# Patient Record
Sex: Male | Born: 2005 | Race: Black or African American | Hispanic: No | Marital: Single | Smoking: Never smoker
Health system: Southern US, Community
[De-identification: ages and names within clinical notes are randomized; demographics above are authoritative.]

## PROBLEM LIST (undated history)

## (undated) ENCOUNTER — Emergency Department (HOSPITAL_BASED_OUTPATIENT_CLINIC_OR_DEPARTMENT_OTHER): Admission: EM | Payer: 59 | Source: Home / Self Care

## (undated) DIAGNOSIS — T7840XA Allergy, unspecified, initial encounter: Secondary | ICD-10-CM

---
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
## 2023-03-15 ENCOUNTER — Ambulatory Visit: Payer: 59 | Admitting: Family Medicine

## 2023-03-15 VITALS — BP 138/63 | HR 68 | Ht 68.0 in | Wt 303.2 lb

## 2023-03-15 DIAGNOSIS — F411 Generalized anxiety disorder: Secondary | ICD-10-CM | POA: Insufficient documentation

## 2023-03-15 DIAGNOSIS — E66813 Obesity, class 3: Secondary | ICD-10-CM | POA: Insufficient documentation

## 2023-03-15 DIAGNOSIS — R5383 Other fatigue: Secondary | ICD-10-CM | POA: Diagnosis not present

## 2023-03-15 DIAGNOSIS — F321 Major depressive disorder, single episode, moderate: Secondary | ICD-10-CM | POA: Diagnosis not present

## 2023-03-15 DIAGNOSIS — Z6841 Body Mass Index (BMI) 40.0 and over, adult: Secondary | ICD-10-CM | POA: Insufficient documentation

## 2023-03-15 MED ORDER — FLUOXETINE HCL 10 MG PO TABS: 10.0000 mg | ORAL_TABLET | Freq: Every day | ORAL | 3 refills | Status: DC

## 2023-03-15 NOTE — Assessment & Plan Note (Addendum)
A1c, lipid panel checked at wellness visit three months ago was normal  PHQ9 and GAD7 are elevated providing new diagnoses  Vit D and b12 levels checked

## 2023-03-15 NOTE — Assessment & Plan Note (Signed)
Will go ahead and start pt on prozac and have him follow up in one month I do believe a lot of his fatigue is related to this. He is agreeable to therapy so we will go ahead and refer.  - denies homicidal and suicidal ideation

## 2023-03-15 NOTE — Progress Notes (Signed)
Established patient visit   Patient: Devin Jacobs   DOB: 2005-08-08   18 y.o. Male  MRN: [id number] Visit Date: 03/15/2023  Today's healthcare provider: Charlton Amor, DO   Chief Complaint  Patient presents with   Fatigue    x107month    SUBJECTIVE    Chief Complaint  Patient presents with   Fatigue    x47month   HPI HPI     Fatigue    Additional comments: x12month      Last edited by Roselyn Reef, CMA on 03/15/2023  3:07 PM.       Pt presents with one month of fatigue. Also admits to not feeling like himself.  Review of Systems  Constitutional:  Positive for fatigue. Negative for activity change and fever.  Respiratory:  Negative for cough and shortness of breath.   Cardiovascular:  Negative for chest pain.  Gastrointestinal:  Negative for abdominal pain.  Genitourinary:  Negative for difficulty urinating.       Current Meds  Medication Sig   Cetirizine HCl (ZYRTEC PO) Take by mouth.   FLUoxetine (PROZAC) 10 MG tablet Take 1 tablet (10 mg total) by mouth daily.   fluticasone (FLONASE) 50 MCG/ACT nasal spray Place into both nostrils daily.    OBJECTIVE    BP 138/63 (BP Location: Left Arm, Patient Position: Sitting, Cuff Size: Large)   Pulse 68   Ht 5\' 8"  (1.727 m)   Wt (!) 303 lb 4 oz (137.6 kg)   SpO2 99%   BMI 46.11 kg/m   Physical Exam Vitals and nursing note reviewed.  Constitutional:      General: He is not in acute distress.    Appearance: Normal appearance.  HENT:     Head: Normocephalic and atraumatic.     Right Ear: External ear normal.     Left Ear: External ear normal.     Nose: Nose normal.  Eyes:     Conjunctiva/sclera: Conjunctivae normal.  Cardiovascular:     Rate and Rhythm: Normal rate and regular rhythm.  Pulmonary:     Effort: Pulmonary effort is normal.     Breath sounds: Normal breath sounds.  Neurological:     General: No focal deficit present.     Mental Status: He is alert and oriented to person, place, and  time.  Psychiatric:        Mood and Affect: Mood normal.        Behavior: Behavior normal.        Thought Content: Thought content normal.        Judgment: Judgment normal.        ASSESSMENT & PLAN    Problem List Items Addressed This Visit       Other   Other fatigue - Primary   A1c, lipid panel checked at wellness visit three months ago was normal  PHQ9 and GAD7 are elevated providing new diagnoses  Vit D and b12 levels checked      Relevant Orders   TSH + free T4   Vitamin B12   Vitamin D (25 hydroxy)   Class 3 severe obesity due to excess calories with serious comorbidity and body mass index (BMI) of 45.0 to 49.9 in adult (HCC)   Relevant Orders   TSH + free T4   Vitamin B12   Vitamin D (25 hydroxy)   Current moderate episode of major depressive disorder without prior episode (HCC)   Will go ahead and start pt on prozac  and have him follow up in one month I do believe a lot of his fatigue is related to this. He is agreeable to therapy so we will go ahead and refer.  - denies homicidal and suicidal ideation      Relevant Medications   FLUoxetine (PROZAC) 10 MG tablet   Other Relevant Orders   Ambulatory referral to Behavioral Health   GAD (generalized anxiety disorder)   Relevant Medications   FLUoxetine (PROZAC) 10 MG tablet   Other Relevant Orders   Ambulatory referral to Behavioral Health    Return in about 4 weeks (around 04/12/2023).      Meds ordered this encounter  Medications   FLUoxetine (PROZAC) 10 MG tablet    Sig: Take 1 tablet (10 mg total) by mouth daily.    Dispense:  30 tablet    Refill:  3    Orders Placed This Encounter  Procedures   TSH + free T4   Vitamin B12   Vitamin D (25 hydroxy)   Ambulatory referral to Behavioral Health    Referral Priority:   Routine    Referral Type:   Psychiatric    Referral Reason:   Specialty Services Required    Requested Specialty:   Behavioral Health    Number of Visits Requested:   1      Charlton Amor, DO  [facility] Primary Care & Sports Medicine at [facility] [phone] (phone) [phone] (fax)  [facility]

## 2023-03-16 ENCOUNTER — Encounter: Payer: Self-pay | Admitting: Family Medicine

## 2023-03-16 ENCOUNTER — Other Ambulatory Visit: Payer: Self-pay | Admitting: Family Medicine

## 2023-03-16 LAB — VITAMIN D 25 HYDROXY (VIT D DEFICIENCY, FRACTURES): Vit D, 25-Hydroxy: 9.2 ng/mL — ABNORMAL LOW (ref 30.0–100.0)

## 2023-03-16 LAB — TSH+FREE T4
Free T4: 1.17 ng/dL (ref 0.93–1.60)
TSH: 1.7 u[IU]/mL (ref 0.450–4.500)

## 2023-03-16 LAB — VITAMIN B12: Vitamin B-12: 331 pg/mL (ref 232–1245)

## 2023-03-16 MED ORDER — CHOLECALCIFEROL 1.25 MG (50000 UT) PO TABS: 1.0000 | ORAL_TABLET | ORAL | 0 refills | Status: DC

## 2023-04-01 ENCOUNTER — Ambulatory Visit: Payer: Self-pay | Admitting: Family Medicine

## 2023-04-01 NOTE — Telephone Encounter (Signed)
 Chief Complaint: foot injury Symptoms: bruise, rounded Frequency: 2 weeks ago Pertinent Negatives: Patient denies fever, inability to walk, pain Disposition: [] ED /[] Urgent Care (no appt availability in office) / [x] Appointment(In office/virtual)/ []  [facility] [facility]/ [] Home Care/ [] Refused Recommended Disposition /[] [place] [facility]/ []  Follow-up with PCP Additional Notes: Patient mother, Jinx, calling with patient next to her c/o foot injury that occurred 2 weeks ago. Patient states he was walking barefoot and stepped on something. Reports they went to UC yesterday and I&D was performed. Mother reports they were unsatisfied with care there and wanted F/U with PCP. Of note, an X-ray was performed today to check for FB left in foot. Scheduled patient per protocol on 04/05/2023. Caregiver verbalized understanding and to call back with worsening symptoms.    Copied from CRM [id number]. Topic: Clinical - Red Word Triage >> Apr 01, 2023  3:40 PM Joesph PARAS wrote: Red Word that prompted transfer to Nurse Triage:   Patient was seen in urgent care through [facility] but mother believes he did not recieve proper care. patient stepped on something roughly two weeks ago with left, pus present and drained last night at urgent care, x-ray done but thinks object stepped on is still in the foot. Reason for Disposition  Minor puncture wound  Answer Assessment - Initial Assessment Questions 2. OBJECT: What was the object that punctured the skin?      unknown 3. DEPTH: How deep do you think the puncture goes?      unknown 4. ONSET: When did the injury occur? (Minutes or hours)     2 weeks ago  Answer Assessment - Initial Assessment Questions 1. MECHANISM: How did the injury happen? (e.g., twisting injury, direct blow)      Wlaking barefoot around the house and stepped on something 2. ONSET: When did the injury happen? (Minutes or hours ago)      2 weeks ago 3. LOCATION: Where is the  injury located?      L foot 4. APPEARANCE of INJURY: What does the injury look like?      Bruised looking - looks like it's gotten worse 5. WEIGHT-BEARING: Can you put weight on that foot? Can you walk (four steps or more)?       Yes 6. SIZE: For cuts, bruises, or swelling, ask: How large is it? (e.g., inches or centimeters;  entire joint)      Size of thumbtack head 7. PAIN: Is there pain? If Yes, ask: How bad is the pain?    (e.g., Scale 1-10; or mild, moderate, severe)   - NONE (0): no pain.   - MILD (1-3): doesn't interfere with normal activities.    - MODERATE (4-7): interferes with normal activities (e.g., work or school) or awakens from sleep, limping.    - SEVERE (8-10): excruciating pain, unable to do any normal activities, unable to walk.      none 8. TETANUS: For any breaks in the skin, ask: When was the last tetanus booster?     Doesn't remember last TDaP. Mom reports he is UTD on all school shots 9. OTHER SYMPTOMS: Do you have any other symptoms?      No, just looks bruised and rounded. Middle is red Reports that they went to UC last night-- reports MD took a needle and poked it to let the pus out, and then used a scalpel Mom reports that MD stated he might need and X-ray in case of FB still inside foot  Protocols used:  Ankle and Foot Injury-A-AH, Puncture Wound-A-AH

## 2023-04-05 ENCOUNTER — Encounter: Payer: Self-pay | Admitting: Medical-Surgical

## 2023-04-05 ENCOUNTER — Ambulatory Visit (INDEPENDENT_AMBULATORY_CARE_PROVIDER_SITE_OTHER): Payer: 59 | Admitting: Medical-Surgical

## 2023-04-05 VITALS — BP 118/73 | HR 78 | Resp 20 | Ht 68.01 in | Wt 307.8 lb

## 2023-04-05 DIAGNOSIS — S90852D Superficial foreign body, left foot, subsequent encounter: Secondary | ICD-10-CM

## 2023-04-05 DIAGNOSIS — F321 Major depressive disorder, single episode, moderate: Secondary | ICD-10-CM | POA: Diagnosis not present

## 2023-04-05 DIAGNOSIS — S90859A Superficial foreign body, unspecified foot, initial encounter: Secondary | ICD-10-CM | POA: Insufficient documentation

## 2023-04-05 MED ORDER — FLUOXETINE HCL 10 MG PO TABS: 10.0000 mg | ORAL_TABLET | Freq: Every day | ORAL | 0 refills | Status: DC

## 2023-04-05 NOTE — Progress Notes (Signed)
 Medical screening examination/treatment was performed by qualified clinical staff member and as supervising provider I was immediately available for consultation/collaboration. I have reviewed documentation and agree with assessment and plan.  Thayer Ohm, DNP, APRN, FNP-BC [facility]

## 2023-04-05 NOTE — Progress Notes (Signed)
 Subjective:  Patient ID: Devin Jacobs, male    DOB: Mar 28, 2005, 18 y.o.   MRN: [id number]  Patient Care Team: Josepha Nickels, DO as PCP - General (Family Medicine)   Chief Complaint:  Foot Injury (LEFT)   HPI:  Devin Jacobs is a 18 y.o. male presenting on 04/05/2023 for Foot Injury (LEFT)   History, Exam,  Impression and Plan Foot Injury    1. Splinter of foot without major open wound, left, subsequent encounter (Primary) Per patient's mother patient stepped on something at home but got infected and developed a "puss boil" on proximal plantar foot centered medially just distal to calcaneous. Patient seen 04/01/2023 and "puss boil" was unroofed and irrigated. Patient's mother noted puss came out and it bled when irrigated.  Patient's mother is concerned about a small mark that looks like a foreign body or splinter but is a discoloration possibly from blood that is approximately 1cm x93mm.  Wound is healing well without complications with only a rough callus remaining. 1x1cm. No signs of infection.  Negative erythema or edema.  No tenderness to palpation. Unable to palpate any foreign body.  Patient and his mother have been cleaning the wound site several times daily.  Wound is mostly healed requires no further antibiotics or wound care.    2. Current moderate episode of major depressive disorder, unspecified whether recurrent (HCC) Patient seen 03/15/2023 by Dr. Leanne Pronto. Patient did not start fluoxetine  because prescription was sent to different pharmacy and patient's mother was not with him to follow up. Mother states she was not aware that fluoxetine  was prescribed.  Patient also had referral for mental health counseling but has not been called for appointment. Has persistent primary complaint of chronic fatigue and anxiey and denies SI/HI.  Dr. Dianah Fort previously stated that she believes his fatigue is related to due to depression. Patient's mother and patient are both agreeable to  treatment with fluoxetine  and counseling. - Start FLUoxetine  (PROZAC ) 10 MG tablet; Take 1 tablet (10 mg total) by mouth daily.  Dispense: 30 tablet; Refill: 0. For anxiety and depression  Continue all other maintenance medications.  Follow up plan: Return in about 4 weeks (around 05/03/2023) for mood follow up with PCP.  Patient contacted to call or return to clinic with any SI/HI thoughts.   Relevant past medical, surgical, family, and social history reviewed and updated as indicated.  Allergies and medications reviewed and updated. Data reviewed: Chart in Epic.   Past Medical History:  Diagnosis Date   Allergy     No past surgical history on file.  Social History   Socioeconomic History   Marital status: Single    Spouse name: Not on file   Number of children: Not on file   Years of education: Not on file   Highest education level: 12th grade  Occupational History   Not on file  Tobacco Use   Smoking status: Never    Passive exposure: Yes   Smokeless tobacco: Never  Vaping Use   Vaping status: Never Used  Substance and Sexual Activity   Alcohol use: Never   Drug use: Never   Sexual activity: Never  Other Topics Concern   Not on file  Social History Narrative   Not on file   Social Drivers of Health   Financial Resource Strain: Medium Risk (03/15/2023)   Overall Financial Resource Strain (CARDIA)    Difficulty of Paying Living Expenses: Somewhat hard  Food Insecurity: Food Insecurity Present (03/15/2023)  Hunger Vital Sign    Worried About Running Out of Food in the Last Year: Often true    Ran Out of Food in the Last Year: Often true  Transportation Needs: No Transportation Needs (03/15/2023)   PRAPARE - Administrator, Civil Service (Medical): No    Lack of Transportation (Non-Medical): No  Physical Activity: Unknown (03/15/2023)   Exercise Vital Sign    Days of Exercise per Week: 0 days    Minutes of Exercise per Session: Not on file  Stress:  Stress Concern Present (03/15/2023)   [employer] of Occupational Health - Occupational Stress Questionnaire    Feeling of Stress : To some extent  Social Connections: Socially Isolated (03/15/2023)   Social Connection and Isolation Panel [NHANES]    Frequency of Communication with Friends and Family: Once a week    Frequency of Social Gatherings with Friends and Family: Twice a week    Attends Religious Services: Never    Database administrator or Organizations: No    Attends Engineer, structural: Not on file    Marital Status: Never married  Intimate Partner Violence: Unknown (05/29/2021)   Received from [employer], [facility]   HITS    Physically Hurt: Not on file    Insult or Talk Down To: Not on file    Threaten Physical Harm: Not on file    Scream or Curse: Not on file    Outpatient Encounter Medications as of 04/05/2023  Medication Sig   [DISCONTINUED] FLUoxetine  (PROZAC ) 10 MG tablet Take 1 tablet (10 mg total) by mouth daily.   FLUoxetine  (PROZAC ) 10 MG tablet Take 1 tablet (10 mg total) by mouth daily.   [DISCONTINUED] Cetirizine HCl (ZYRTEC PO) Take by mouth.   [DISCONTINUED] Cholecalciferol  1.25 MG (50000 UT) TABS Take 1 tablet by mouth once a week.   [DISCONTINUED] fluticasone (FLONASE) 50 MCG/ACT nasal spray Place into both nostrils daily.   No facility-administered encounter medications on file as of 04/05/2023.    No Known Allergies  Review of Systems      Objective:  BP 118/73 (BP Location: Right Arm, Cuff Size: Large)   Pulse 78   Resp 20   Ht 5' 8.01" (1.727 m)   Wt (!) 307 lb 12.8 oz (139.6 kg)   SpO2 98%   BMI 46.79 kg/m    Wt Readings from Last 3 Encounters:  04/05/23 (!) 307 lb 12.8 oz (139.6 kg) (>99%, Z= 3.14)*  03/15/23 (!) 303 lb 4 oz (137.6 kg) (>99%, Z= 3.09)*  12/08/22 297 lb 4 oz (134.8 kg) (>99%, Z= 3.02)*   * Growth percentiles are based on CDC (Boys, 2-20 Years) data.    Physical Exam Musculoskeletal:        Feet:  Feet:     Comments: Unroofed  "puss blister" without S&S of infection and healing well without complications    Results for orders placed or performed in visit on 03/15/23  TSH + free T4   Collection Time: 03/15/23  3:34 PM  Result Value Ref Range   TSH 1.700 0.450 - 4.500 uIU/mL   Free T4 1.17 0.93 - 1.60 ng/dL  Vitamin B12   Collection Time: 03/15/23  3:34 PM  Result Value Ref Range   Vitamin B-12 331 232 - 1,245 pg/mL  Vitamin D  (25 hydroxy)   Collection Time: 03/15/23  3:34 PM  Result Value Ref Range   Vit D, 25-Hydroxy 9.2 (L) 30.0 - 100.0 ng/mL  Pertinent labs & imaging results that were available during my care of the patient were reviewed by me and considered in my medical decision making.   Continue healthy lifestyle choices, including diet (rich in fruits, vegetables, and lean proteins, and low in salt and simple carbohydrates) and exercise (at least 30 minutes of moderate physical activity daily).  The above assessment and management plan was discussed with the patient. The patient verbalized understanding of and has agreed to the management plan. Patient is aware to call the clinic if they develop any new symptoms or if symptoms persist or worsen. Patient is aware when to return to the clinic for a follow-up visit. Patient educated on when it is appropriate to go to the emergency department.   Caridad Chard Student AGNP

## 2023-04-12 ENCOUNTER — Ambulatory Visit: Payer: 59 | Admitting: Family Medicine

## 2023-05-06 ENCOUNTER — Ambulatory Visit: Payer: Self-pay | Admitting: Family Medicine

## 2023-05-06 NOTE — Telephone Encounter (Signed)
 1st attempt to call patient's phone, "call cannot be completed as dialed" error message.  2nd attempt to call patient's mother's phone, "call cannot be completed" as dialed error message.  Summary: red patches on tongue and back of throat, cough, sore throat   Copied From CRM [id number]. Reason for Triage: Patient okayed for [place] to talk to mom, [facility]. Patient has cough, sore throat, low grade fever, runny nose,red patches on tongue and back of throat, severe allergies,  this started 03/12 in the morning.

## 2023-05-06 NOTE — Telephone Encounter (Signed)
 4th attempt. "Call cannot be completed as dialed". Routing to PCP clinic.

## 2023-05-06 NOTE — Telephone Encounter (Signed)
 Second attempt to contact patient-unable to speak or leave message. "Call cannot be completed as dialed."  Summary: red patches on tongue and back of throat, cough, sore throat   Copied From CRM [id number]. Reason for Triage: Patient okayed for [place] to talk to mom, [facility]. Patient has cough, sore throat, low grade fever, runny nose,red patches on tongue and back of throat, severe allergies,  this started 03/12 in the morning.

## 2023-05-07 ENCOUNTER — Telehealth (INDEPENDENT_AMBULATORY_CARE_PROVIDER_SITE_OTHER): Admitting: Physician Assistant

## 2023-05-07 ENCOUNTER — Encounter: Payer: Self-pay | Admitting: Physician Assistant

## 2023-05-07 ENCOUNTER — Ambulatory Visit: Payer: Self-pay | Admitting: Family Medicine

## 2023-05-07 VITALS — Ht 68.0 in | Wt 307.0 lb

## 2023-05-07 DIAGNOSIS — J101 Influenza due to other identified influenza virus with other respiratory manifestations: Secondary | ICD-10-CM

## 2023-05-07 DIAGNOSIS — J301 Allergic rhinitis due to pollen: Secondary | ICD-10-CM | POA: Diagnosis not present

## 2023-05-07 MED ORDER — OSELTAMIVIR PHOSPHATE 75 MG PO CAPS: 75.0000 mg | ORAL_CAPSULE | Freq: Two times a day (BID) | ORAL | 0 refills | Status: AC

## 2023-05-07 MED ORDER — ONDANSETRON HCL 4 MG PO TABS: 4.0000 mg | ORAL_TABLET | Freq: Three times a day (TID) | ORAL | 0 refills | Status: AC | PRN

## 2023-05-07 NOTE — Progress Notes (Signed)
 Virtual Visit via Video Note   This visit type was conducted per patient request This format is felt to be appropriate for this patient at this time.  All issues noted in this document were discussed and addressed.  A limited physical exam was performed with this format.  A verbal consent was obtained for the virtual visit.   Date:  05/10/2023   ID:  Devin Jacobs, DOB 07/08/2005, MRN [id number]  Patient Location: Home Provider Location: Office/Clinic  PCP:  Charlton Amor, DO   Chief Complaint  Patient presents with   Sore throat     History of Present Illness:       The patient does have symptoms concerning for COVID-19 infection (fever, chills, cough, or new shortness of breath).   COVID test at home was negative. Flu Test was positive.   Discussed the use of AI scribe software for clinical note transcription with the patient, who gave verbal consent to proceed.  History of Present Illness   The patient, with a history of allergies managed with Zyrtec, presents with a positive flu test. He has been symptomatic since Wednesday with coughing, sneezing, runny nose, vomiting, and headache. The vomiting started this morning and was a single episode. He denies diarrhea and is currently asymptomatic for nausea. He is hydrating well and has been advised to consume electrolyte drinks to prevent dehydration.         Past Medical History:  Diagnosis Date   Allergy     History reviewed. No pertinent surgical history.  Family History  Problem Relation Age of Onset   Kidney Stones Mother    Obesity Mother     Social History   Socioeconomic History   Marital status: Single    Spouse name: Not on file   Number of children: Not on file   Years of education: Not on file   Highest education level: 12th grade  Occupational History   Not on file  Tobacco Use   Smoking status: Never    Passive exposure: Yes   Smokeless tobacco: Never  Vaping Use   Vaping status: Never  Used  Substance and Sexual Activity   Alcohol use: Never   Drug use: Never   Sexual activity: Never  Other Topics Concern   Not on file  Social History Narrative   Not on file   Social Drivers of Health   Financial Resource Strain: Medium Risk (03/15/2023)   Overall Financial Resource Strain (CARDIA)    Difficulty of Paying Living Expenses: Somewhat hard  Food Insecurity: Food Insecurity Present (03/15/2023)   Hunger Vital Sign    Worried About Running Out of Food in the Last Year: Often true    Ran Out of Food in the Last Year: Often true  Transportation Needs: No Transportation Needs (03/15/2023)   PRAPARE - Administrator, Civil Service (Medical): No    Lack of Transportation (Non-Medical): No  Physical Activity: Unknown (03/15/2023)   Exercise Vital Sign    Days of Exercise per Week: 0 days    Minutes of Exercise per Session: Not on file  Stress: Stress Concern Present (03/15/2023)   [employer] of Occupational Health - Occupational Stress Questionnaire    Feeling of Stress : To some extent  Social Connections: Socially Isolated (03/15/2023)   Social Connection and Isolation Panel [NHANES]    Frequency of Communication with Friends and Family: Once a week    Frequency of Social Gatherings with Friends and Family: Twice  a week    Attends Religious Services: Never    Active Member of Clubs or Organizations: No    Attends Banker Meetings: Not on file    Marital Status: Never married  Intimate Partner Violence: Unknown (05/29/2021)   Received from [facility]   HITS    Physically Hurt: Not on file    Insult or Talk Down To: Not on file    Threaten Physical Harm: Not on file    Scream or Curse: Not on file    Outpatient Medications Prior to Visit  Medication Sig Dispense Refill   FLUoxetine (PROZAC) 10 MG tablet Take 1 tablet (10 mg total) by mouth daily. 30 tablet 0   No facility-administered medications prior to visit.     No Known Allergies   Social History   Tobacco Use   Smoking status: Never    Passive exposure: Yes   Smokeless tobacco: Never  Vaping Use   Vaping status: Never Used  Substance Use Topics   Alcohol use: Never   Drug use: Never     Review of Systems  Constitutional:  Negative for fever and malaise/fatigue.  HENT:  Positive for congestion and sore throat. Negative for ear pain.   Respiratory:  Positive for cough. Negative for shortness of breath.   Cardiovascular:  Negative for chest pain and leg swelling.  Gastrointestinal:  Negative for abdominal pain.  Genitourinary:  Negative for dysuria.  Musculoskeletal:  Negative for back pain and myalgias.  Neurological:  Positive for headaches. Negative for weakness.     Labs/Other Tests and Data Reviewed:    Recent Labs: 12/08/2022: ALT 17; BUN 14; Creatinine, Ser 1.04; Potassium 4.4; Sodium 142 03/15/2023: TSH 1.700   Recent Lipid Panel Lab Results  Component Value Date/Time   CHOL 156 12/08/2022 10:08 AM   TRIG 63 12/08/2022 10:08 AM   HDL 72 12/08/2022 10:08 AM   CHOLHDL 2.2 12/08/2022 10:08 AM   LDLCALC 71 12/08/2022 10:08 AM    Wt Readings from Last 3 Encounters:  05/07/23 (!) 307 lb (139.3 kg) (>99%, Z= 3.14)*  04/05/23 (!) 307 lb 12.8 oz (139.6 kg) (>99%, Z= 3.14)*  03/15/23 (!) 303 lb 4 oz (137.6 kg) (>99%, Z= 3.09)*   * Growth percentiles are based on CDC (Boys, 2-20 Years) data.     Objective:    Vital Signs:  Ht 5\' 8"  (1.727 m)   Wt (!) 307 lb (139.3 kg)   BMI 46.68 kg/m    Physical Exam  Unable to perform due to it being a video visit.  ASSESSMENT & PLAN:   Influenza A Assessment & Plan: Positive influenza test. Symptoms include cough, rhinorrhea, headache, and vomiting. Eligible for oseltamivir. - Prescribed oseltamivir. - Advised hydration with water and low-sugar electrolyte drinks. - Provided work and school absence note for yesterday and today.  Nausea Nausea linked to influenza with  one vomiting episode. - Prescribed ondansetron.  Orders: -     Oseltamivir Phosphate; Take 1 capsule (75 mg total) by mouth 2 (two) times daily.  Dispense: 10 capsule; Refill: 0 -     Ondansetron HCl; Take 1 tablet (4 mg total) by mouth every 8 (eight) hours as needed for nausea or vomiting.  Dispense: 20 tablet; Refill: 0  Seasonal allergic rhinitis due to pollen Assessment & Plan: Managed with cetirizine. No contraindications with current medications. - Continue cetirizine unless vomiting occurs post-dose, then delay until next day.       Assessment and  Plan      No orders of the defined types were placed in this encounter.    Meds ordered this encounter  Medications   oseltamivir (TAMIFLU) 75 MG capsule    Sig: Take 1 capsule (75 mg total) by mouth 2 (two) times daily.    Dispense:  10 capsule    Refill:  0   ondansetron (ZOFRAN) 4 MG tablet    Sig: Take 1 tablet (4 mg total) by mouth every 8 (eight) hours as needed for nausea or vomiting.    Dispense:  20 tablet    Refill:  0     Follow Up:  In Person prn  Signed,

## 2023-05-07 NOTE — Telephone Encounter (Signed)
 Copied from CRM [id number]. Topic: Clinical - Red Word Triage >> May 07, 2023 10:19 AM Devin Jacobs wrote: Red Word that prompted transfer to Nurse Triage:  Patient has cough, sore throat,  runny nose, severe allergies, called yesterday and now has vomiting    Chief Complaint: sore throat Symptoms: vomiting, headache, runny nose cough Frequency: Wednesday Pertinent Negatives: Patient denies SOB Disposition: [] ED /[] Urgent Care (no appt availability in office) / [x] Appointment(In office/virtual)/ []   [facility]/ [] Home Care/ [] Refused Recommended Disposition /[] [facility]/ []  Follow-up with PCP Additional Notes:   Reason for Disposition  [1] Patient is NOT HIGH RISK AND [2] strongly requests antiviral medicine AND [3] flu symptoms present < 48 hours  Answer Assessment - Initial Assessment Questions 1. WORST SYMPTOM: "What is your worst symptom?" (e.g., cough, runny nose, muscle aches, headache, sore throat, fever)      Sore throat  2. ONSET: "When did your flu symptoms start?"      Wednesday  3. COUGH: "How bad is the cough?"       Preventing sleep 4. RESPIRATORY DISTRESS: "Describe your breathing."      no 5. FEVER: "Do you have a fever?" If Yes, ask: "What is your temperature, how was it measured, and when did it start?"     Warm to touch  6. EXPOSURE: "Were you exposed to someone with influenza?"       no 7. FLU VACCINE: "Did you get a flu shot this year?"     N/a 8. HIGH RISK DISEASE: "Do you have any chronic medical problems?" (e.g., heart or lung disease, asthma, weak immune system, or other HIGH RISK conditions)     no 10. OTHER SYMPTOMS: "Do you have any other symptoms?"  (e.g., runny nose, muscle aches, headache, sore throat)       Runny nose , cough and vomiting 4 times  Protocols used: Influenza (Flu) - [facility]

## 2023-05-10 DIAGNOSIS — J309 Allergic rhinitis, unspecified: Secondary | ICD-10-CM | POA: Insufficient documentation

## 2023-05-10 DIAGNOSIS — J101 Influenza due to other identified influenza virus with other respiratory manifestations: Secondary | ICD-10-CM | POA: Insufficient documentation

## 2023-05-10 NOTE — Assessment & Plan Note (Signed)
 Positive influenza test. Symptoms include cough, rhinorrhea, headache, and vomiting. Eligible for oseltamivir. - Prescribed oseltamivir. - Advised hydration with water and low-sugar electrolyte drinks. - Provided work and school absence note for yesterday and today.  Nausea Nausea linked to influenza with one vomiting episode. - Prescribed ondansetron.

## 2023-05-10 NOTE — Assessment & Plan Note (Signed)
 Managed with cetirizine. No contraindications with current medications. - Continue cetirizine unless vomiting occurs post-dose, then delay until next day.

## 2023-05-11 ENCOUNTER — Other Ambulatory Visit: Payer: Self-pay | Admitting: Medical-Surgical

## 2023-05-11 DIAGNOSIS — F321 Major depressive disorder, single episode, moderate: Secondary | ICD-10-CM

## 2023-07-01 ENCOUNTER — Encounter: Payer: Self-pay | Admitting: Family Medicine

## 2024-03-16 ENCOUNTER — Ambulatory Visit: Payer: Self-pay

## 2024-03-16 ENCOUNTER — Ambulatory Visit: Admitting: Family Medicine

## 2024-03-16 NOTE — Telephone Encounter (Signed)
 Will see pt at appt. If thoughts of self harm will need to go to ER

## 2024-03-16 NOTE — Telephone Encounter (Signed)
 The patient has been scheduled to see the provider on 03/22/24 to address the following symptoms.   FYI - Per E2C2 Triage notes - patient states he is becoming more depressed and can tell his mood his is changing, he is withdraw, and his attitude is not great.

## 2024-03-16 NOTE — Telephone Encounter (Signed)
 FYI Only or Action Required?: FYI only for provider: appointment scheduled on tomorrow.  Patient was last seen in primary care on 05/07/2023 by Milon Cleaves, PA.  Called Nurse Triage reporting No chief complaint on file..Depression Mood swings  Symptoms began several weeks ago.  Interventions attempted: Nothing.  Symptoms are: gradually worsening.  Triage Disposition: See Physician Within 24 Hours  Patient/caregiver understands and will follow disposition?: yes                                  Reason for Triage: Patient states he is becoming more depressed and can tell his mood his is changing, he is withdraw, and his attitude is not great.  Would like to establish at [facility].    Reason for Disposition  [1] Depression AND [2] getting worse (e.g., sleeping poorly, less able to do activities of daily living)  Answer Assessment - Initial Assessment Questions 1. CONCERN: What happened that made you call today?    Mom noticed this pattern and wanted him to call 2. DEPRESSION SYMPTOM SCREENING: How are you feeling overall? (e.g., decreased energy, increased sleeping or difficulty sleeping, difficulty concentrating, feelings of sadness, guilt, hopelessness, or worthlessness)     Sad - aggravated 3. RISK OF HARM - SUICIDAL IDEATION:  Do you ever have thoughts of hurting or killing yourself?  (e.g., yes, no, no but preoccupation with thoughts about death)     no 4. RISK OF HARM - HOMICIDAL IDEATION:  Do you ever have thoughts of hurting or killing someone else?  (e.g., yes, no, no but preoccupation with thoughts about death)     no 5. FUNCTIONAL IMPAIRMENT: How have things been going for you overall? Have you had more difficulty than usual doing your normal daily activities?  (e.g., better, same, worse; self-care, school, work, interactions)     no 6. SUPPORT: Who is with you now? Who do you live with? Do you have family or friends  who you can talk to?      His mom, friend and friend's mom 7. THERAPIST: Do you have a counselor or therapist? If Yes, ask: What is their name?     no 8. STRESSORS: Has there been any new stress or recent changes in your life?     no 9. ALCOHOL USE OR SUBSTANCE USE (DRUG USE): Do you drink alcohol or use any illegal drugs?     no 10. OTHER: Do you have any other physical symptoms right now? (e.g., fever)       no  Protocols used: Depression-A-AH

## 2024-03-17 NOTE — Telephone Encounter (Signed)
 Attempted to contact the patient regarding the provider's recommendation. No answer. Left the following information on vm if the patient experiences a crisis:   [facility] [address], [place], [place] [address] [phone] 24/7  The patient was informed to contact the clinic or send a MyChart message if they have any questions.

## 2024-03-22 ENCOUNTER — Ambulatory Visit: Admitting: Urgent Care
